# Patient Record
Sex: Male | Born: 2000 | Hispanic: Yes | Marital: Single | Smoking: Never smoker
Health system: Southern US, Community
[De-identification: ages and names within clinical notes are randomized; demographics above are authoritative.]

---
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
## 2019-01-12 ENCOUNTER — Encounter: Payer: Self-pay | Admitting: Emergency Medicine

## 2019-01-12 ENCOUNTER — Other Ambulatory Visit: Payer: Self-pay

## 2019-01-12 ENCOUNTER — Emergency Department
Admission: EM | Admit: 2019-01-12 | Discharge: 2019-01-12 | Disposition: A | Discharge status: Home / Self Care | Payer: Self-pay | Attending: Emergency Medicine | Admitting: Emergency Medicine

## 2019-01-12 ENCOUNTER — Emergency Department: Payer: Self-pay

## 2019-01-12 DIAGNOSIS — R52 Pain, unspecified: Secondary | ICD-10-CM

## 2019-01-12 DIAGNOSIS — L600 Ingrowing nail: Secondary | ICD-10-CM | POA: Insufficient documentation

## 2019-01-12 DIAGNOSIS — Z79899 Other long term (current) drug therapy: Secondary | ICD-10-CM | POA: Insufficient documentation

## 2019-01-12 MED ORDER — SULFAMETHOXAZOLE-TRIMETHOPRIM 800-160 MG PO TABS: 1.0000 | ORAL_TABLET | Freq: Two times a day (BID) | ORAL | 0 refills | Status: AC

## 2019-01-12 NOTE — ED Triage Notes (Signed)
Pt arrives with complaints of pain to pt's big right toe that started yesterday.

## 2019-01-12 NOTE — ED Provider Notes (Signed)
[facility] Emergency Department Provider Note  ____________________________________________  Time seen: Approximately 6:18 PM  I have reviewed the triage vital signs and the nursing notes.   HISTORY  Chief Complaint Toe Pain    HPI Jorge Powers is a 18 y.o. male presents to the emergency department with right great toe pain and erythema that started after patient "peeled his toenails".  Patient has noticed no purulent exudate being expressed from around the toenail.  No fever or chills.  Patient reports that he has a great deal of pain with ambulation.  He has never experienced similar symptoms in the past.  No alleviating measures have been attempted.   History reviewed. No pertinent past medical history.  There are no active problems to display for this patient.   History reviewed. No pertinent surgical history.  Prior to Admission medications   Medication Sig Start Date End Date Taking? Authorizing Provider  naproxen (NAPROSYN) 500 MG tablet Take 1 tablet (500 mg total) by mouth 2 (two) times daily with a meal. 12/13/17   Joni Reining, PA-C  promethazine-dextromethorphan (PROMETHAZINE-DM) 6.25-15 MG/5ML syrup Take 5 mLs by mouth 4 (four) times daily as needed for cough. 12/13/17   Joni Reining, PA-C  sulfamethoxazole-trimethoprim (BACTRIM DS,SEPTRA DS) 800-160 MG tablet Take 1 tablet by mouth 2 (two) times daily for 7 days. 01/12/19 01/19/19  Orvil Feil, PA-C    Allergies Patient has no known allergies.  No family history on file.  Social History Social History   Tobacco Use  . Smoking status: Never Smoker  . Smokeless tobacco: Never Used  Substance Use Topics  . Alcohol use: No  . Drug use: No     Review of Systems  Constitutional: No fever/chills Eyes: No visual changes. No discharge ENT: No upper respiratory complaints. Cardiovascular: no chest pain. Respiratory: no cough. No SOB. Gastrointestinal: No  abdominal pain.  No nausea, no vomiting.  No diarrhea.  No constipation. Musculoskeletal: Patient has right great toe pain.  Skin: Negative for rash, abrasions, lacerations, ecchymosis. Neurological: Negative for headaches, focal weakness or numbness.  ____________________________________________   PHYSICAL EXAM:  VITAL SIGNS: ED Triage Vitals  Enc Vitals Group     BP 01/12/19 1709 139/78     Pulse Rate 01/12/19 1709 80     Resp 01/12/19 1709 (!) 8     Temp 01/12/19 1709 98.2 F (36.8 C)     Temp Source 01/12/19 1709 Oral     SpO2 01/12/19 1709 93 %     Weight 01/12/19 1710 180 lb (81.6 kg)     Height 01/12/19 1710 5\' 5"  (1.651 m)     Head Circumference --      Peak Flow --      Pain Score 01/12/19 1710 9     Pain Loc --      Pain Edu? --      Excl. in GC? --      Constitutional: Alert and oriented. Well appearing and in no acute distress. Eyes: Conjunctivae are normal. PERRL. EOMI. Head: Atraumatic. Cardiovascular: Normal rate, regular rhythm. Normal S1 and S2.  Good peripheral circulation. Respiratory: Normal respiratory effort without tachypnea or retractions. Lungs CTAB. Good air entry to the bases with no decreased or absent breath sounds. Musculoskeletal: Full range of motion to all extremities. No gross deformities appreciated. Neurologic:  Normal speech and language. No gross focal neurologic deficits are appreciated.  Skin: Patient has edema along the lateral aspect of the right great  toe with surrounding cellulitis. Psychiatric: Mood and affect are normal. Speech and behavior are normal. Patient exhibits appropriate insight and judgement.   ____________________________________________   LABS (all labs ordered are listed, but only abnormal results are displayed)  Labs Reviewed - No data to display ____________________________________________  EKG   ____________________________________________  RADIOLOGY I personally viewed and evaluated these images as  part of my medical decision making, as well as reviewing the written report by the radiologist.  Dg Foot Complete Right  Result Date: 01/12/2019 CLINICAL DATA:  Pain EXAM: RIGHT FOOT COMPLETE - 3+ VIEW COMPARISON:  None. FINDINGS: There is no evidence of fracture or dislocation. There is no evidence of arthropathy or other focal bone abnormality. Soft tissues are unremarkable. IMPRESSION: Negative. Electronically Signed   By: Charlett Nose M.D.   On: 01/12/2019 17:51    ____________________________________________    PROCEDURES  Procedure(s) performed:    Procedures    Medications - No data to display   ____________________________________________   INITIAL IMPRESSION / ASSESSMENT AND PLAN / ED COURSE  Pertinent labs & imaging results that were available during my care of the patient were reviewed by me and considered in my medical decision making (see chart for details).  Review of the [place] CSRS was performed in accordance of the [facility] prior to dispensing any controlled drugs.      Assessment and plan Ingrown toenail Patient presents to the emergency department with an ingrown toenail.  Patient education regarding warm soapy soaks was given.  Due to surrounding cellulitis, patient was treated with Bactrim.  Patient was advised to follow-up with primary care as needed.  All patient questions were answered.     ____________________________________________  FINAL CLINICAL IMPRESSION(S) / ED DIAGNOSES  Final diagnoses:  Ingrown toenail      NEW MEDICATIONS STARTED DURING THIS VISIT:  ED Discharge Orders         Ordered    sulfamethoxazole-trimethoprim (BACTRIM DS,SEPTRA DS) 800-160 MG tablet  2 times daily     01/12/19 1816              This chart was dictated using voice recognition software/Dragon. Despite best efforts to proofread, errors can occur which can change the meaning. Any change was purely unintentional.    Orvil Feil, PA-C 01/12/19  Orbie Hurst, MD 01/12/19 [phone]

## 2021-11-29 DIAGNOSIS — R059 Cough, unspecified: Secondary | ICD-10-CM | POA: Diagnosis not present

## 2021-11-29 DIAGNOSIS — R1032 Left lower quadrant pain: Secondary | ICD-10-CM | POA: Diagnosis not present

## 2021-11-29 DIAGNOSIS — Z20822 Contact with and (suspected) exposure to covid-19: Secondary | ICD-10-CM | POA: Diagnosis not present

## 2021-11-29 DIAGNOSIS — J069 Acute upper respiratory infection, unspecified: Secondary | ICD-10-CM | POA: Diagnosis not present

## 2021-11-30 ENCOUNTER — Other Ambulatory Visit: Payer: Self-pay

## 2021-11-30 DIAGNOSIS — R739 Hyperglycemia, unspecified: Secondary | ICD-10-CM | POA: Insufficient documentation

## 2021-11-30 DIAGNOSIS — R109 Unspecified abdominal pain: Secondary | ICD-10-CM | POA: Diagnosis not present

## 2021-11-30 DIAGNOSIS — K37 Unspecified appendicitis: Secondary | ICD-10-CM | POA: Diagnosis not present

## 2021-11-30 DIAGNOSIS — K5900 Constipation, unspecified: Secondary | ICD-10-CM | POA: Diagnosis not present

## 2021-11-30 DIAGNOSIS — N4404 Torsion of appendix epididymis: Secondary | ICD-10-CM | POA: Diagnosis not present

## 2021-11-30 DIAGNOSIS — E1165 Type 2 diabetes mellitus with hyperglycemia: Secondary | ICD-10-CM | POA: Diagnosis not present

## 2021-11-30 LAB — COMPREHENSIVE METABOLIC PANEL
ALT: 37 U/L (ref 0–44)
AST: 36 U/L (ref 15–41)
Albumin: 4.3 g/dL (ref 3.5–5.0)
Alkaline Phosphatase: 89 U/L (ref 38–126)
Anion gap: 9 (ref 5–15)
BUN: 18 mg/dL (ref 6–20)
CO2: 24 mmol/L (ref 22–32)
Calcium: 9.2 mg/dL (ref 8.9–10.3)
Chloride: 101 mmol/L (ref 98–111)
Creatinine, Ser: 0.84 mg/dL (ref 0.61–1.24)
GFR, Estimated: >60 mL/min (ref >60)
Glucose, Bld: 183 mg/dL — ABNORMAL HIGH (ref 70–99)
Potassium: 3.3 mmol/L — ABNORMAL LOW (ref 3.5–5.1)
Sodium: 134 mmol/L — ABNORMAL LOW (ref 135–145)
Total Bilirubin: 0.7 mg/dL (ref 0.3–1.2)
Total Protein: 7.6 g/dL (ref 6.5–8.1)

## 2021-11-30 LAB — CBC
HCT: 41.8 % (ref 39.0–52.0)
Hemoglobin: 14.2 g/dL (ref 13.0–17.0)
MCH: 30 pg (ref 26.0–34.0)
MCHC: 34 g/dL (ref 30.0–36.0)
MCV: 88.4 fL (ref 80.0–100.0)
Platelets: 289 10*3/uL (ref 150–400)
RBC: 4.73 MIL/uL (ref 4.22–5.81)
RDW: 12.1 % (ref 11.5–15.5)
WBC: 8.1 10*3/uL (ref 4.0–10.5)
nRBC: 0 % (ref 0.0–0.2)

## 2021-11-30 LAB — LIPASE, BLOOD: Lipase: 27 U/L (ref 11–51)

## 2021-11-30 LAB — URINALYSIS, ROUTINE W REFLEX MICROSCOPIC
Bilirubin Urine: NEGATIVE
Glucose, UA: NEGATIVE mg/dL
Hgb urine dipstick: NEGATIVE
Ketones, ur: NEGATIVE mg/dL
Leukocytes,Ua: NEGATIVE
Nitrite: NEGATIVE
Protein, ur: NEGATIVE mg/dL
Specific Gravity, Urine: 1.026 (ref 1.005–1.030)
pH: 6 (ref 5.0–8.0)

## 2021-11-30 NOTE — ED Triage Notes (Signed)
Pt presents to ER c/o left sided abd pain since Sunday.  Pt states pain is like a stabbing in nature.  Pt endorses some nausea but no diarrhea or emesis.  Pt denies urinary issues.  Pt A&O x4 at this time in NAD.

## 2021-12-01 ENCOUNTER — Emergency Department: Payer: 59

## 2021-12-01 ENCOUNTER — Encounter: Payer: Self-pay | Admitting: Radiology

## 2021-12-01 ENCOUNTER — Emergency Department
Admission: EM | Admit: 2021-12-01 | Discharge: 2021-12-01 | Disposition: A | Discharge status: Home / Self Care | Payer: 59 | Attending: Emergency Medicine | Admitting: Emergency Medicine

## 2021-12-01 DIAGNOSIS — R109 Unspecified abdominal pain: Secondary | ICD-10-CM | POA: Diagnosis not present

## 2021-12-01 DIAGNOSIS — K6389 Other specified diseases of intestine: Secondary | ICD-10-CM

## 2021-12-01 DIAGNOSIS — K5900 Constipation, unspecified: Secondary | ICD-10-CM

## 2021-12-01 DIAGNOSIS — R739 Hyperglycemia, unspecified: Secondary | ICD-10-CM

## 2021-12-01 MED ORDER — ONDANSETRON HCL 4 MG/2ML IJ SOLN
4.0000 mg | Freq: Once | INTRAMUSCULAR | Status: AC
  Administered 2021-12-01: 4 mg via INTRAVENOUS
  Filled 2021-12-01: qty 2

## 2021-12-01 MED ORDER — LACTULOSE 20 G PO PACK: 20.0000 g | PACK | Freq: Two times a day (BID) | ORAL | 0 refills | Status: AC

## 2021-12-01 MED ORDER — IOHEXOL 300 MG/ML  SOLN
100.0000 mL | Freq: Once | INTRAMUSCULAR | Status: AC | PRN
  Administered 2021-12-01: 100 mL via INTRAVENOUS
  Filled 2021-12-01: qty 100

## 2021-12-01 MED ORDER — ONDANSETRON 4 MG PO TBDP: 4.0000 mg | ORAL_TABLET | Freq: Three times a day (TID) | ORAL | 0 refills | Status: AC | PRN

## 2021-12-01 MED ORDER — KETOROLAC TROMETHAMINE 30 MG/ML IJ SOLN
15.0000 mg | Freq: Once | INTRAMUSCULAR | Status: AC
  Administered 2021-12-01: 15 mg via INTRAVENOUS
  Filled 2021-12-01: qty 1

## 2021-12-01 NOTE — ED Provider Notes (Signed)
[facility] Provider Note    Event Date/Time   First MD Initiated Contact with Patient 12/01/21 0030     (approximate)   History   Abdominal Pain   HPI  Jorge Powers is a 21 y.o. male with no significant past medical history who presents for evaluation of abdominal pain.  Patient reports that his symptoms started 3 days ago.  The pain is sharp, stabbing, constant, located in the left lower quadrant associated with nausea.  No diarrhea, no constipation, no vomiting, no chest pain or shortness of breath, no testicular pain or swelling, no penile discharge, no dysuria or hematuria.  No prior abdominal surgeries.     History reviewed. No pertinent past medical history.  History reviewed. No pertinent surgical history.   Physical Exam   Triage Vital Signs: ED Triage Vitals  Enc Vitals Group     BP 11/30/21 2156 123/86     Pulse Rate 11/30/21 2156 95     Resp 11/30/21 2156 16     Temp 11/30/21 2156 98.2 F (36.8 C)     Temp Source 11/30/21 2156 Oral     SpO2 11/30/21 2156 97 %     Weight 11/30/21 2157 180 lb (81.6 kg)     Height 11/30/21 2157 5\' 5"  (1.651 m)     Head Circumference --      Peak Flow --      Pain Score 11/30/21 2157 10     Pain Loc --      Pain Edu? --      Excl. in [place]? --     Most recent vital signs: Vitals:   11/30/21 2156  BP: 123/86  Pulse: 95  Resp: 16  Temp: 98.2 F (36.8 C)  SpO2: 97%     Constitutional: Alert and oriented. Well appearing and in no apparent distress. HEENT:      Head: Normocephalic and atraumatic.         Eyes: Conjunctivae are normal. Sclera is non-icteric.       Mouth/Throat: Mucous membranes are moist.       Neck: Supple with no signs of meningismus. Cardiovascular: Regular rate and rhythm. No murmurs, gallops, or rubs. 2+ symmetrical distal pulses are present in all extremities.  Respiratory: Normal respiratory effort. Lungs are clear to auscultation bilaterally.   Gastrointestinal: Soft, mild tenderness to palpation on the left lower quadrant, and non distended with positive bowel sounds. No rebound or guarding. Genitourinary: No CVA tenderness. Musculoskeletal:  No edema, cyanosis, or erythema of extremities. Neurologic: Normal speech and language. Face is symmetric. Moving all extremities. No gross focal neurologic deficits are appreciated. Skin: Skin is warm, dry and intact. No rash noted. Psychiatric: Mood and affect are normal. Speech and behavior are normal.  ED Results / Procedures / Treatments   Labs (all labs ordered are listed, but only abnormal results are displayed) Labs Reviewed  COMPREHENSIVE METABOLIC PANEL - Abnormal; Notable for the following components:      Result Value   Sodium 134 (*)    Potassium 3.3 (*)    Glucose, Bld 183 (*)    All other components within normal limits  URINALYSIS, ROUTINE W REFLEX MICROSCOPIC - Abnormal; Notable for the following components:   Color, Urine YELLOW (*)    APPearance CLOUDY (*)    All other components within normal limits  LIPASE, BLOOD  CBC     EKG  none   RADIOLOGY I, Rudene Re, attending MD, have personally  viewed and interpreted the images obtained during this visit as below:  CT abdomen pelvis showing no appendicitis, no diverticulitis.  Patient seems constipated   ___________________________________________________ Interpretation by Radiologist:  CT ABDOMEN PELVIS W CONTRAST  Result Date: 12/01/2021 CLINICAL DATA:  Left lower quadrant pain. EXAM: CT ABDOMEN AND PELVIS WITH CONTRAST TECHNIQUE: Multidetector CT imaging of the abdomen and pelvis was performed using the standard protocol following bolus administration of intravenous contrast. CONTRAST:  OMNIPAQUE IOHEXOL 300 MG/ML  SOLN COMPARISON:  None. FINDINGS: Lower chest: No acute abnormality. There is scattered linear scarring or atelectasis in the lung bases. The cardiac size is normal. Hepatobiliary:  No focal liver abnormality is seen. No gallstones, gallbladder wall thickening, or biliary dilatation. Pancreas: Unremarkable. No pancreatic ductal dilatation or surrounding inflammatory changes. Spleen: Normal in size without focal abnormality. Adrenals/Urinary Tract: The adrenal glands and bilateral renal cortex are unremarkable. No evidence of urinary stones or obstruction is seen. There is no bladder thickening or dilatation. Stomach/Bowel: The stomach is distended with fluid and food products but has a normal wall thickness. The unopacified small bowel is unremarkable with a normal caliber appendix noted present. There is mild fecal stasis. There is an ovoid fatty structure anterior to the mid descending colon, measuring 2.3 x 1.0 cm with surrounding inflammatory changes, findings compatible with a torsed epiploic appendage. There are sigmoid diverticula without evidence of diverticulitis. Vascular/Lymphatic: No significant vascular findings are present. No enlarged abdominal or pelvic lymph nodes. Reproductive: Prostate is unremarkable. Other: There are pelvic phleboliths. There is no free air, hemorrhage or fluid. No ventral hernia. Musculoskeletal: No acute or significant osseous findings. IMPRESSION: 1. Epiploic appendagitis, anterior aspect of the mid descending colon. 2. Diverticulosis without evidence of diverticulitis.  Constipation. 3. Stomach distended with fluid and food products which could be due to a recent meal or impaired gastric emptying. There is no wall thickening. 4. No evidence of urinary stones or obstruction. Electronically Signed   By: Almira Bar M.D.   On: 12/01/2021 01:53      PROCEDURES:  Critical Care performed: No  Procedures    IMPRESSION / MDM / ASSESSMENT AND PLAN / ED COURSE  I reviewed the triage vital signs and the nursing notes.   21 y.o. male with no significant past medical history who presents for evaluation of LLQ abdominal pain and nausea x 3 days.   Patient is well-appearing in no distress with normal vital signs, abdomen is soft with mild left lower quadrant tenderness, no rebound or guarding  Ddx: Diverticulitis versus constipation versus colitis versus UTI versus kidney stone versus epiploic appendagitis versus mesenteric adenitis   Plan: CBC, chemistry panel, LFTs and lipase, urinalysis, CT abdomen pelvis.  Will give IV Toradol and Zofran for pain and nausea   MEDICATIONS GIVEN IN ED: Medications  ketorolac (TORADOL) 30 MG/ML injection 15 mg (15 mg Intravenous Given 12/01/21 0117)  ondansetron (ZOFRAN) injection 4 mg (4 mg Intravenous Given 12/01/21 0118)  iohexol (OMNIPAQUE) 300 MG/ML solution 100 mL (100 mLs Intravenous Contrast Given 12/01/21 0134)     ED COURSE: CT showing epiploic appendagitis and constipation.  Labs showing normal white count.  UA negative for blood or UTI.  Patient does have hyperglycemia with no known history of diabetes.  No signs of DKA.  Discussed this finding with patient and recommended recheck of fasting blood glucose with PCP in a week.  Pain is well controlled with Toradol.  Will discharge home on 3-day course of lactulose, increase hydration for  constipation.  Recommended Tylenol and ibuprofen for pain from the epiploic appendagitis.  Recommended return to the emergency room for worsening pain or fever.  Otherwise close follow-up with primary care doctor.  No indication for admission at this time   Consults: None   EMR reviewed none available       FINAL CLINICAL IMPRESSION(S) / ED DIAGNOSES   Final diagnoses:  Epiploic appendagitis  Constipation, unspecified constipation type  Hyperglycemia     Rx / DC Orders   ED Discharge Orders          Ordered    lactulose (CEPHULAC) 20 g packet  2 times daily        12/01/21 0216    ondansetron (ZOFRAN-ODT) 4 MG disintegrating tablet  Every 8 hours PRN        12/01/21 0216             Note:  This document was prepared using Dragon  voice recognition software and may include unintentional dictation errors.   Alfred Levins, [place], MD 12/01/21 [phone]

## 2021-12-01 NOTE — Discharge Instructions (Addendum)
Take lactulose twice a day for 3 days.  Make sure to drink lots of water with it to prevent dehydration.  This will help relieve your constipation.  Take ibuprofen or Tylenol for pain.  Take Zofran as needed for nausea.  Return to the emergency room for worsening abdominal pain or fever.  Otherwise follow-up with your primary care doctor in 3 days.  As we discussed, your sugar was elevated.  Is important that you see your primary care doctor for repeat blood glucose in about a week to rule out diabetes.

## 2022-11-03 IMAGING — CT CT ABD-PELV W/ CM
2 of 4 series · 16 of 46 positions shown, 18 images · IV contrast (APPLIED)
Comparison: None.

CLINICAL DATA: Left lower quadrant pain.

EXAM:
CT ABDOMEN AND PELVIS WITH CONTRAST
TECHNIQUE: Multidetector CT imaging of the abdomen and pelvis was performed
using the standard protocol following bolus administration of
intravenous contrast.
CONTRAST:  100mL OMNIPAQUE IOHEXOL 300 MG/ML  SOLN

[Series 2: axial st · axial · 0.88mm/px · z∈[-990,-540]mm · 13 of 100 slices shown, 15 images]
[im 5/100  soft-tissue]
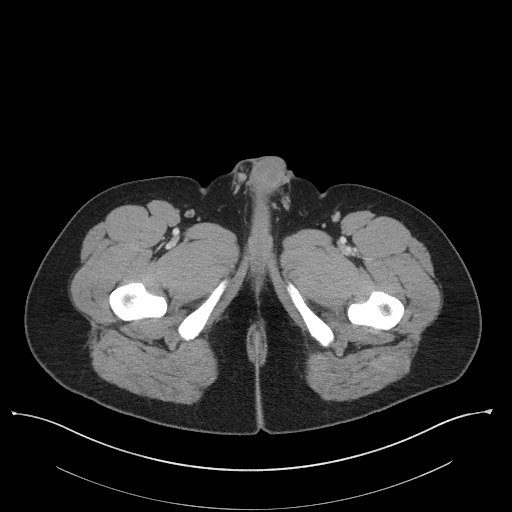
[im 5/100  bone]
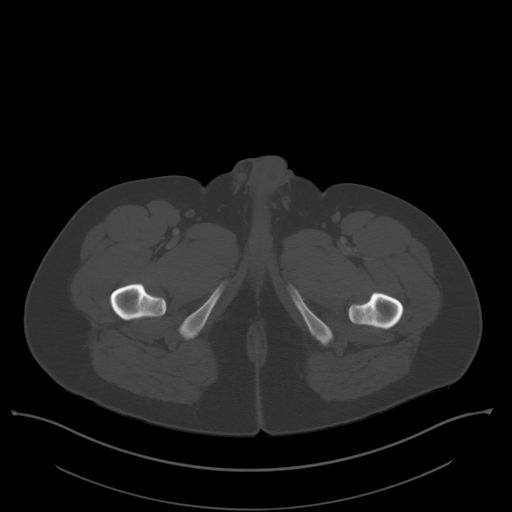
[im 13/100  soft-tissue]
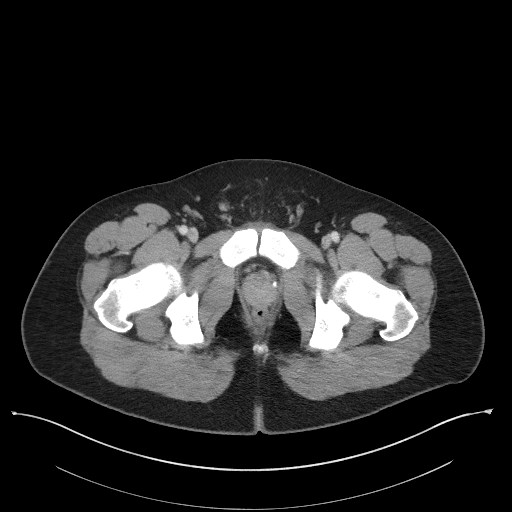
[im 22/100  soft-tissue]
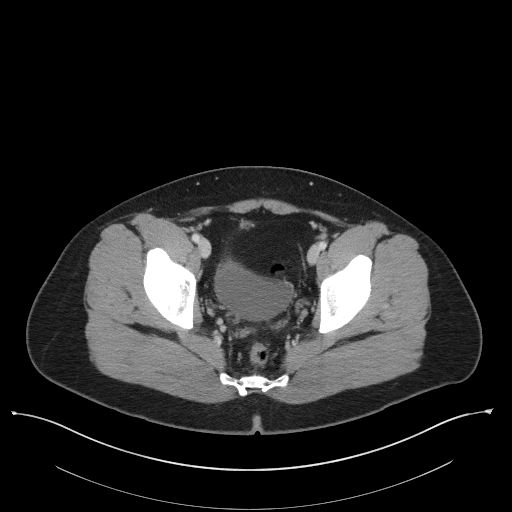
[im 26/100  soft-tissue]
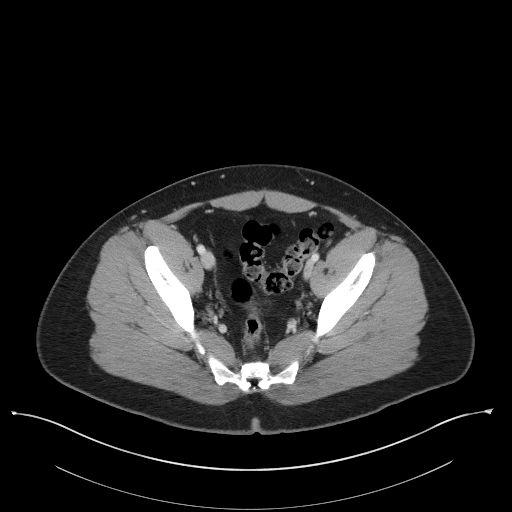
[im 35/100  soft-tissue]
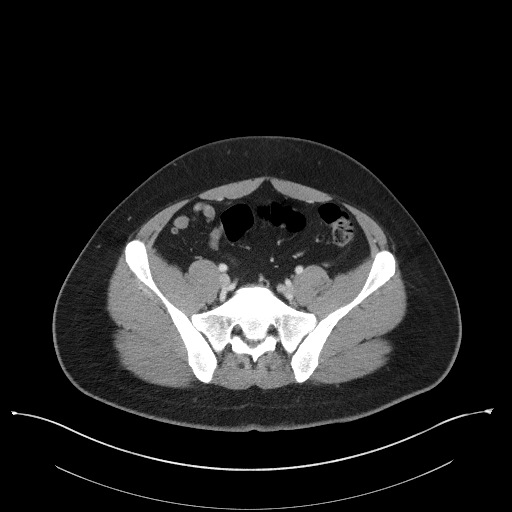
[im 44/100  soft-tissue]
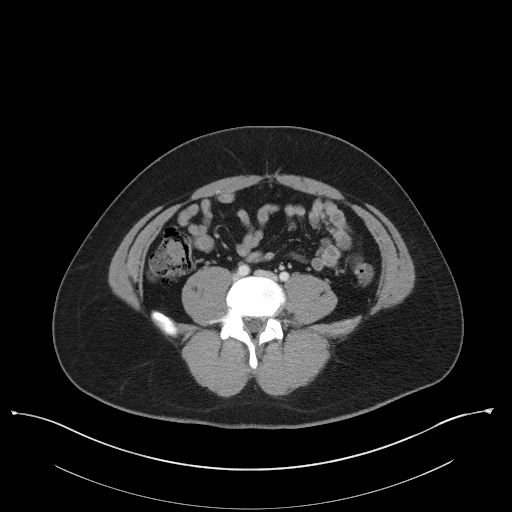
[im 52/100  soft-tissue]
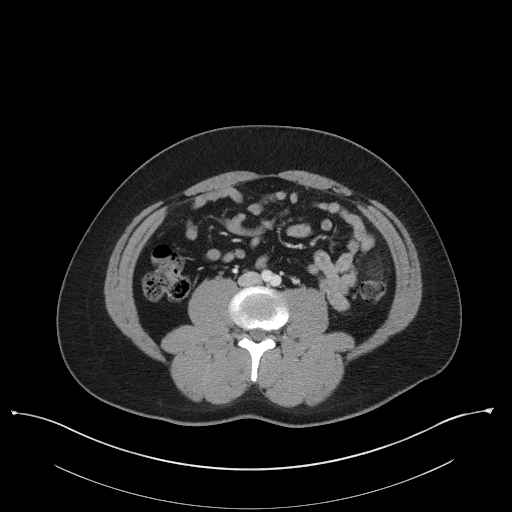
[im 56/100  soft-tissue]
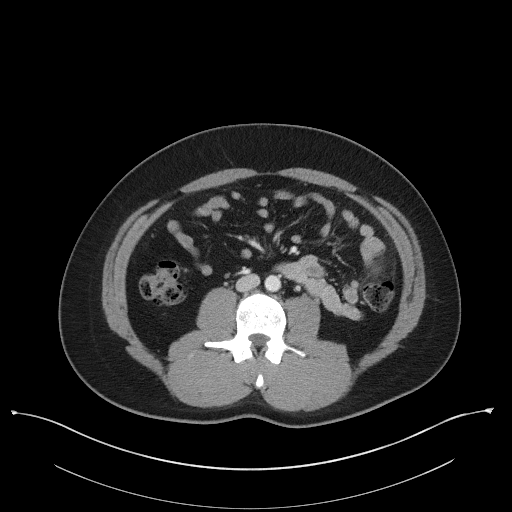
[im 65/100  soft-tissue]
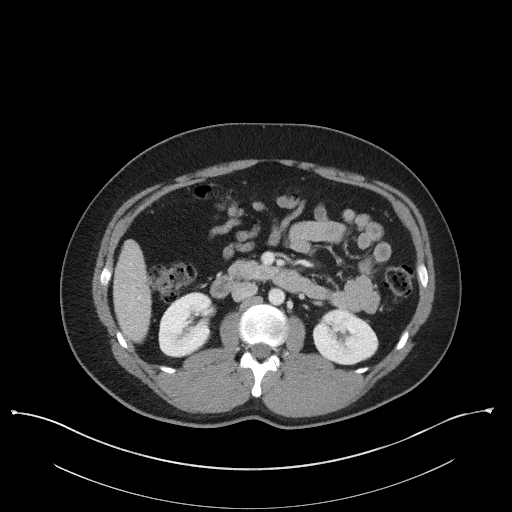
[im 65/100  bone]
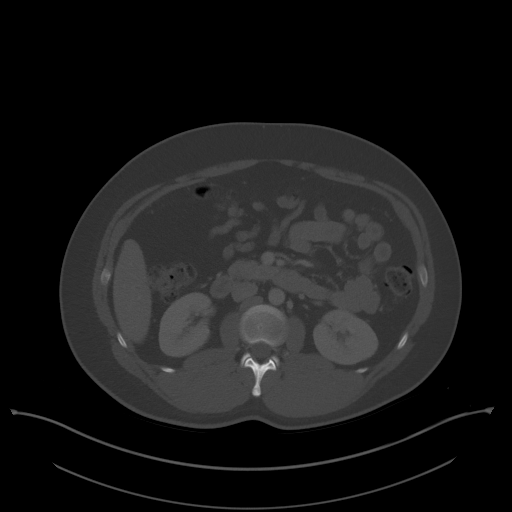
[im 74/100  soft-tissue]
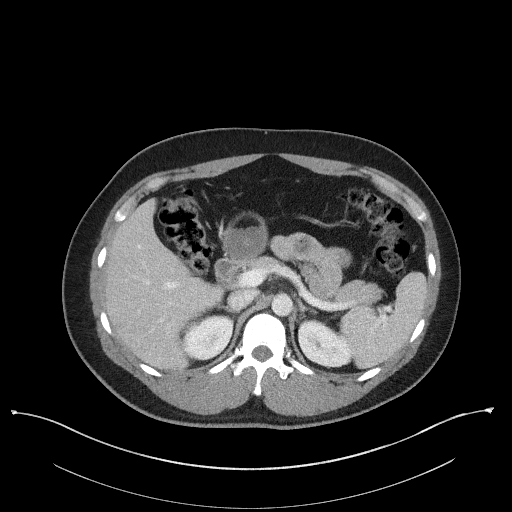
[im 78/100  soft-tissue]
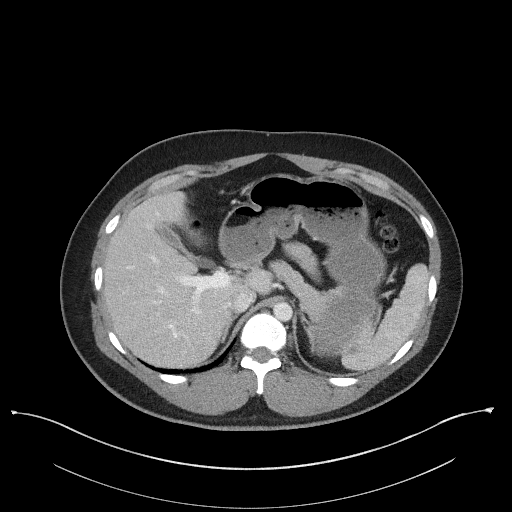
[im 87/100  soft-tissue]
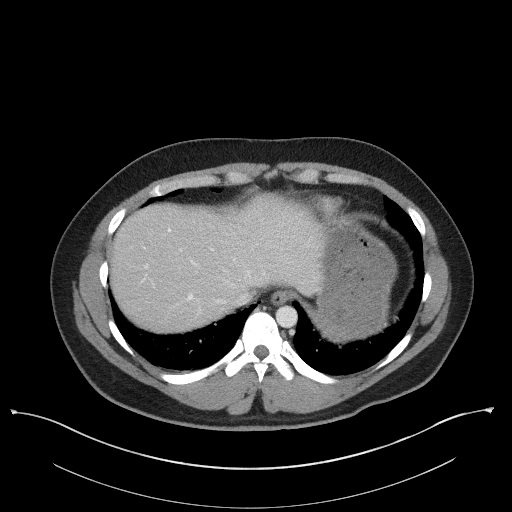
[im 95/100  soft-tissue]
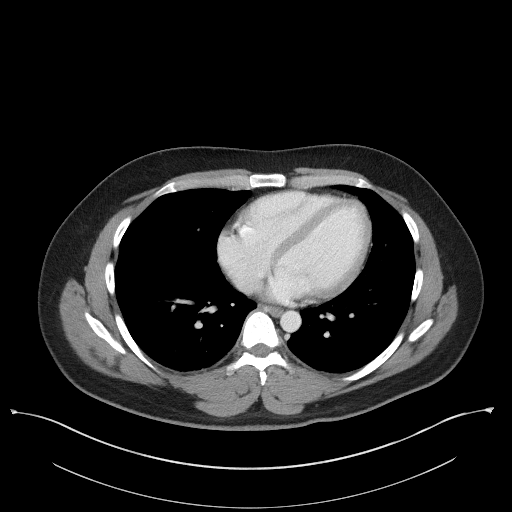

[Series 5: coronal st · coronal · 0.82mm/px · 3 of 94 slices shown]
[im 32/94  soft-tissue]
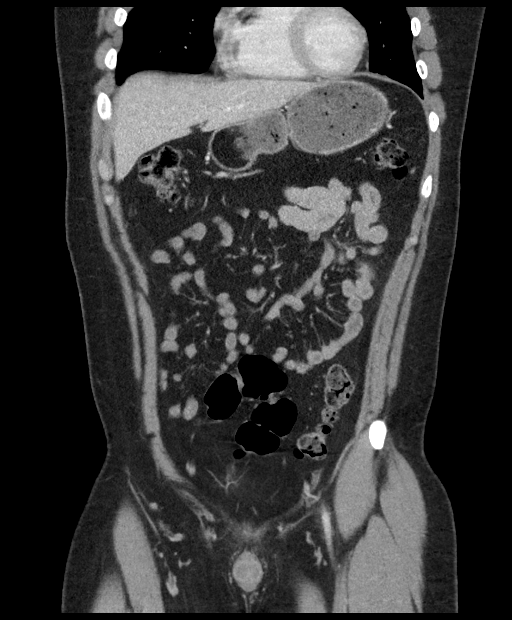
[im 42/94  soft-tissue]
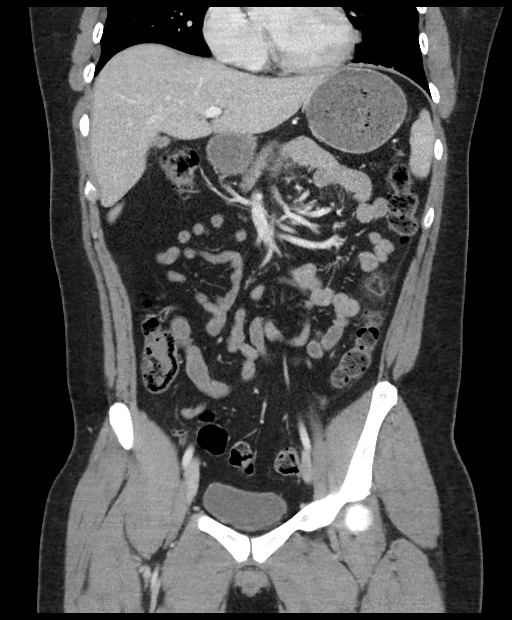
[im 52/94  soft-tissue]
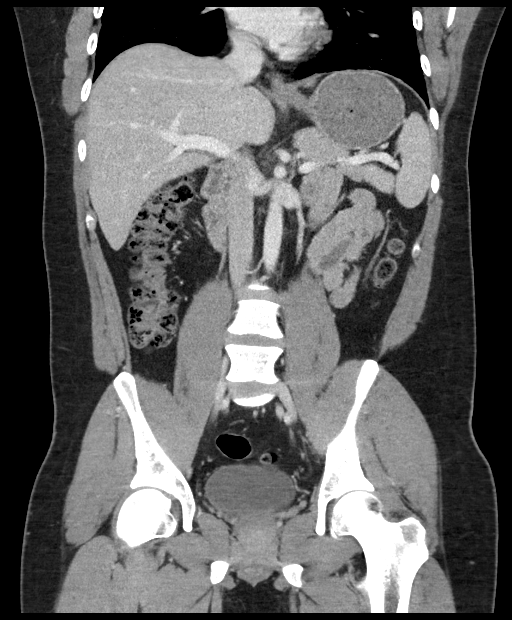

[16 of 46 positions shown; findings below may reference images not displayed]

FINDINGS: Lower chest: No acute abnormality. There is scattered linear
scarring or atelectasis in the lung bases. The cardiac size is
normal.

Hepatobiliary: No focal liver abnormality is seen. No gallstones,
gallbladder wall thickening, or biliary dilatation.

Pancreas: Unremarkable. No pancreatic ductal dilatation or
surrounding inflammatory changes.

Spleen: Normal in size without focal abnormality.

Adrenals/Urinary Tract: The adrenal glands and bilateral renal
cortex are unremarkable. No evidence of urinary stones or
obstruction is seen. There is no bladder thickening or dilatation.

Stomach/Bowel: The stomach is distended with fluid and food products
but has a normal wall thickness. The unopacified small bowel is
unremarkable with a normal caliber appendix noted present.

There is mild fecal stasis. There is an ovoid fatty structure
anterior to the mid descending colon, measuring 2.3 x 1.0 cm with
surrounding inflammatory changes, findings compatible with a torsed
epiploic appendage. There are sigmoid diverticula without evidence
of diverticulitis.

Vascular/Lymphatic: No significant vascular findings are present. No
enlarged abdominal or pelvic lymph nodes.

Reproductive: Prostate is unremarkable.

Other: There are pelvic phleboliths. There is no free air,
hemorrhage or fluid. No ventral hernia.

Musculoskeletal: No acute or significant osseous findings.
IMPRESSION: 1. Epiploic appendagitis, anterior aspect of the mid descending
colon.
2. Diverticulosis without evidence of diverticulitis.  Constipation.
3. Stomach distended with fluid and food products which could be due
to a recent meal or impaired gastric emptying. There is no wall
thickening.
4. No evidence of urinary stones or obstruction.
